# Patient Record
Sex: Male | Born: 1945 | Race: Black or African American | Hispanic: No | Marital: Married | Smoking: Never smoker
Health system: Southern US, Community
[De-identification: ages and names within clinical notes are randomized; demographics above are authoritative.]

## PROBLEM LIST (undated history)

## (undated) DIAGNOSIS — I10 Essential (primary) hypertension: Secondary | ICD-10-CM

## (undated) HISTORY — PX: BACK SURGERY: SHX140

---

## 2004-03-10 ENCOUNTER — Ambulatory Visit (HOSPITAL_COMMUNITY): Admission: RE | Admit: 2004-03-10 | Discharge: 2004-03-11 | Payer: Self-pay | Admitting: Neurosurgery

## 2004-04-08 ENCOUNTER — Encounter: Admission: RE | Admit: 2004-04-08 | Discharge: 2004-04-08 | Payer: Self-pay | Admitting: Neurosurgery

## 2019-12-23 ENCOUNTER — Emergency Department (HOSPITAL_BASED_OUTPATIENT_CLINIC_OR_DEPARTMENT_OTHER)
Admission: EM | Admit: 2019-12-23 | Discharge: 2019-12-23 | Disposition: A | Discharge status: Home / Self Care | Payer: Medicare Other | Attending: Emergency Medicine | Admitting: Emergency Medicine

## 2019-12-23 ENCOUNTER — Encounter (HOSPITAL_BASED_OUTPATIENT_CLINIC_OR_DEPARTMENT_OTHER): Payer: Self-pay

## 2019-12-23 ENCOUNTER — Emergency Department (HOSPITAL_BASED_OUTPATIENT_CLINIC_OR_DEPARTMENT_OTHER): Payer: Medicare Other

## 2019-12-23 ENCOUNTER — Other Ambulatory Visit: Payer: Self-pay

## 2019-12-23 DIAGNOSIS — Z79899 Other long term (current) drug therapy: Secondary | ICD-10-CM | POA: Diagnosis not present

## 2019-12-23 DIAGNOSIS — I10 Essential (primary) hypertension: Secondary | ICD-10-CM | POA: Insufficient documentation

## 2019-12-23 DIAGNOSIS — M5442 Lumbago with sciatica, left side: Secondary | ICD-10-CM | POA: Insufficient documentation

## 2019-12-23 DIAGNOSIS — M25552 Pain in left hip: Secondary | ICD-10-CM | POA: Diagnosis present

## 2019-12-23 HISTORY — DX: Essential (primary) hypertension: I10

## 2019-12-23 MED ORDER — METHYLPREDNISOLONE 4 MG PO TBPK: ORAL_TABLET | ORAL | 0 refills | Status: AC

## 2019-12-23 MED ORDER — ACETAMINOPHEN 500 MG PO TABS
1000.0000 mg | ORAL_TABLET | Freq: Once | ORAL | Status: AC
  Administered 2019-12-23: 1000 mg via ORAL
  Filled 2019-12-23: qty 2

## 2019-12-23 MED ORDER — PREDNISONE 50 MG PO TABS
60.0000 mg | ORAL_TABLET | Freq: Once | ORAL | Status: AC
  Administered 2019-12-23: 60 mg via ORAL
  Filled 2019-12-23: qty 1

## 2019-12-23 NOTE — ED Triage Notes (Signed)
Pt c/o left sided hip pain X 2 weeks.

## 2019-12-23 NOTE — ED Provider Notes (Signed)
[facility] EMERGENCY DEPARTMENT Provider Note   CSN: [id number] Arrival date & time: 12/23/19  1303     History Chief Complaint  Patient presents with  . Hip Pain    Curtis Caldwell is a 74 y.o. male.  Curtis Caldwell is a 74 y.o. male with a history of hypertension, who presents to the ED for evaluation of left-sided hip pain that has been occurring intermittently over the past 2 weeks.  History of similar pain on the right many years ago.  Reports that pain comes and goes, he states that occasionally it started to radiate down the side of his left leg.  He reports that today he was bending to get something out of his car and severe pain shot down his leg causing him to fall backwards.  He reports he landed primarily on his upper back, did not hit his head or land on his hip and denies any new pain since fall.  Has not had any numbness or weakness in the lower extremities, no loss of bowel or bladder control or saddle anesthesia.  No associated abdominal pain or urinary symptoms.  No fevers or chills.  He is taken 2 doses of ibuprofen intermittently to try and help but has not tried anything else to treat pain.  No other aggravating or relieving factors.        Past Medical History:  Diagnosis Date  . Hypertension     There are no problems to display for this patient.   Past Surgical History:  Procedure Laterality Date  . BACK SURGERY         No family history on file.  Social History   Tobacco Use  . Smoking status: Never Smoker  Substance Use Topics  . Alcohol use: Never  . Drug use: Never    Home Medications Prior to Admission medications   Medication Sig Start Date End Date Taking? Authorizing Provider  rosuvastatin (CRESTOR) 20 MG tablet  04/27/16  Yes [provider]  Olmesartan-amLODIPine-HCTZ 40-5-12.5 MG TABS Take 1 tablet by mouth at bedtime. 12/12/19   [provider]    Allergies    Patient has no known  allergies.  Review of Systems   Review of Systems  Constitutional: Negative for chills and fever.  HENT: Negative.   Respiratory: Negative for shortness of breath.   Cardiovascular: Negative for chest pain.  Gastrointestinal: Negative for abdominal pain, constipation, diarrhea, nausea and vomiting.  Genitourinary: Negative for dysuria, flank pain, frequency and hematuria.  Musculoskeletal: Positive for arthralgias (L hip) and back pain. Negative for gait problem, joint swelling, myalgias and neck pain.  Skin: Negative for color change, rash and wound.  Neurological: Negative for weakness and numbness.    Physical Exam Updated Vital Signs BP 139/80 (BP Location: Right Arm)   Pulse 64   Temp 98.6 F (37 C) (Oral)   Resp 18   Ht 6\' 4"  (1.93 m)   Wt 100.2 kg   SpO2 100%   BMI 26.90 kg/m   Physical Exam Vitals and nursing note reviewed.  Constitutional:      General: He is not in acute distress.    Appearance: He is well-developed. He is not diaphoretic.  HENT:     Head: Atraumatic.  Eyes:     General:        Right eye: No discharge.        Left eye: No discharge.  Cardiovascular:     Pulses:  Radial pulses are 2+ on the right side and 2+ on the left side.       Dorsalis pedis pulses are 2+ on the right side and 2+ on the left side.       Posterior tibial pulses are 2+ on the right side and 2+ on the left side.  Pulmonary:     Effort: Pulmonary effort is normal. No respiratory distress.  Abdominal:     General: Bowel sounds are normal. There is no distension.     Palpations: Abdomen is soft. There is no mass.     Tenderness: There is no abdominal tenderness. There is no guarding.     Comments: Abdomen soft, nondistended, nontender to palpation in all quadrants without guarding or peritoneal signs, no CVA tenderness bilaterally  Musculoskeletal:     Cervical back: Neck supple.     Comments: Tenderness to palpation over left low back musculature, no focal bony  tenderness over the hip and normal range of motion.  Pain made worse with range of motion of the lower extremities and sideways twisting  Skin:    General: Skin is warm and dry.     Capillary Refill: Capillary refill takes less than 2 seconds.  Neurological:     Mental Status: He is alert and oriented to person, place, and time.     Comments: Alert, clear speech, following commands. Moving all extremities without difficulty. Bilateral lower extremities with 5/5 strength in proximal and distal muscle groups and with dorsi and plantar flexion. Sensation intact in bilateral lower extremities. Ambulatory with steady gait  Psychiatric:        Behavior: Behavior normal.     ED Results / Procedures / Treatments   Labs (all labs ordered are listed, but only abnormal results are displayed) Labs Reviewed - No data to display  EKG None  Radiology DG Lumbar Spine Complete  Result Date: 12/23/2019 CLINICAL DATA:  Fall, back pain EXAM: LUMBAR SPINE - COMPLETE 4+ VIEW COMPARISON:  None. FINDINGS: Levoscoliosis of the spine noted. Advanced lumbar degenerative change at L4-5 and L5-S1 with disc space narrowing, vacuum disc phenomena, and endplate osteophytes. No acute compression fracture, wedge-shaped deformity or focal kyphosis. Lower lumbar facet arthropathy as well at L4-S1. No visualized pars defects. Visualized pelvis intact. Nonobstructive bowel gas pattern. IMPRESSION: Lower lumbar spine degenerative changes and associated scoliosis. No acute finding by plain radiography. Electronically Signed   By: Judie Petit.  Shick M.D.   On: 12/23/2019 14:53   DG Hip Unilat W or Wo Pelvis 2-3 Views Left  Result Date: 12/23/2019 CLINICAL DATA:  Fall, low back and hip pain EXAM: DG HIP (WITH OR WITHOUT PELVIS) 2-3V LEFT COMPARISON:  None. FINDINGS: Intact left hip. Negative for fracture. Bony pelvis and hips appear symmetric. Degenerative changes of the lumbosacral spine. Benign pelvic vascular calcifications. Penile  prosthesis noted. IMPRESSION: No acute osseous finding. Electronically Signed   By: Judie Petit.  Shick M.D.   On: 12/23/2019 14:50    Procedures Procedures (including critical care time)  Medications Ordered in ED Medications  acetaminophen (TYLENOL) tablet 1,000 mg (1,000 mg Oral Given 12/23/19 1442)  predniSONE (DELTASONE) tablet 60 mg (60 mg Oral Given 12/23/19 1442)    ED Course  I have reviewed the triage vital signs and the nursing notes.  Pertinent labs & imaging results that were available during my care of the patient were reviewed by me and considered in my medical decision making (see chart for details).    MDM Rules/Calculators/A&P  74 year old male arrives complaining of left hip pain intermittently over the past 2 weeks, he reports pain seems to radiate down his leg with certain motions and today when trying to get something out of his car pain shot down his leg causing him to fall backwards, he did not land on his back or hip and did not hit his head, denies any new pain since falling but decided to come in for evaluation of this left hip and back pain.  On exam he has tenderness over the left low back musculature and with certain movements pain radiates down the lateral aspect of the leg.  He has no neurologic deficits on exam, no loss of bowel or bladder control or saddle anesthesia to suggest cauda equina syndrome.  Suspect patient may have sciatica or lumbar radiculopathy.  X-rays performed which show some chronic degenerative changes of the lumbar spine but no acute fracture or abnormality of the lumbar spine or left hip.  Went in to discuss results with patient and he had left from the department, it seems that he got confused after speaking to one of the registration staff and thought that he was done and ready to leave.  I called and spoke with the patient over the phone and discussed his x-ray results.  Will send in prescription for Medrol Dosepak to his  pharmacy to help with inflammation I have asked him to take Tylenol and use over-the-counter lidocaine patches as well and follow-up with his PCP if not improving.  Final Clinical Impression(s) / ED Diagnoses Final diagnoses:  Acute left-sided low back pain with left-sided sciatica    Rx / DC Orders ED Discharge Orders         Ordered    methylPREDNISolone (MEDROL DOSEPAK) 4 MG TBPK tablet     Discontinue  Reprint     12/23/19 1555           Dartha Lodge, PA-C 12/23/19 1600    Linwood Dibbles, MD 12/24/19 [phone]

## 2021-11-27 IMAGING — CR DG HIP (WITH OR WITHOUT PELVIS) 2-3V*L*
3 series · 3 of 3 positions shown · non-contrast
Comparison: None.

CLINICAL DATA: Fall, low back and hip pain

EXAM:
DG HIP (WITH OR WITHOUT PELVIS) 2-3V LEFT

[t pelvis a.p.]
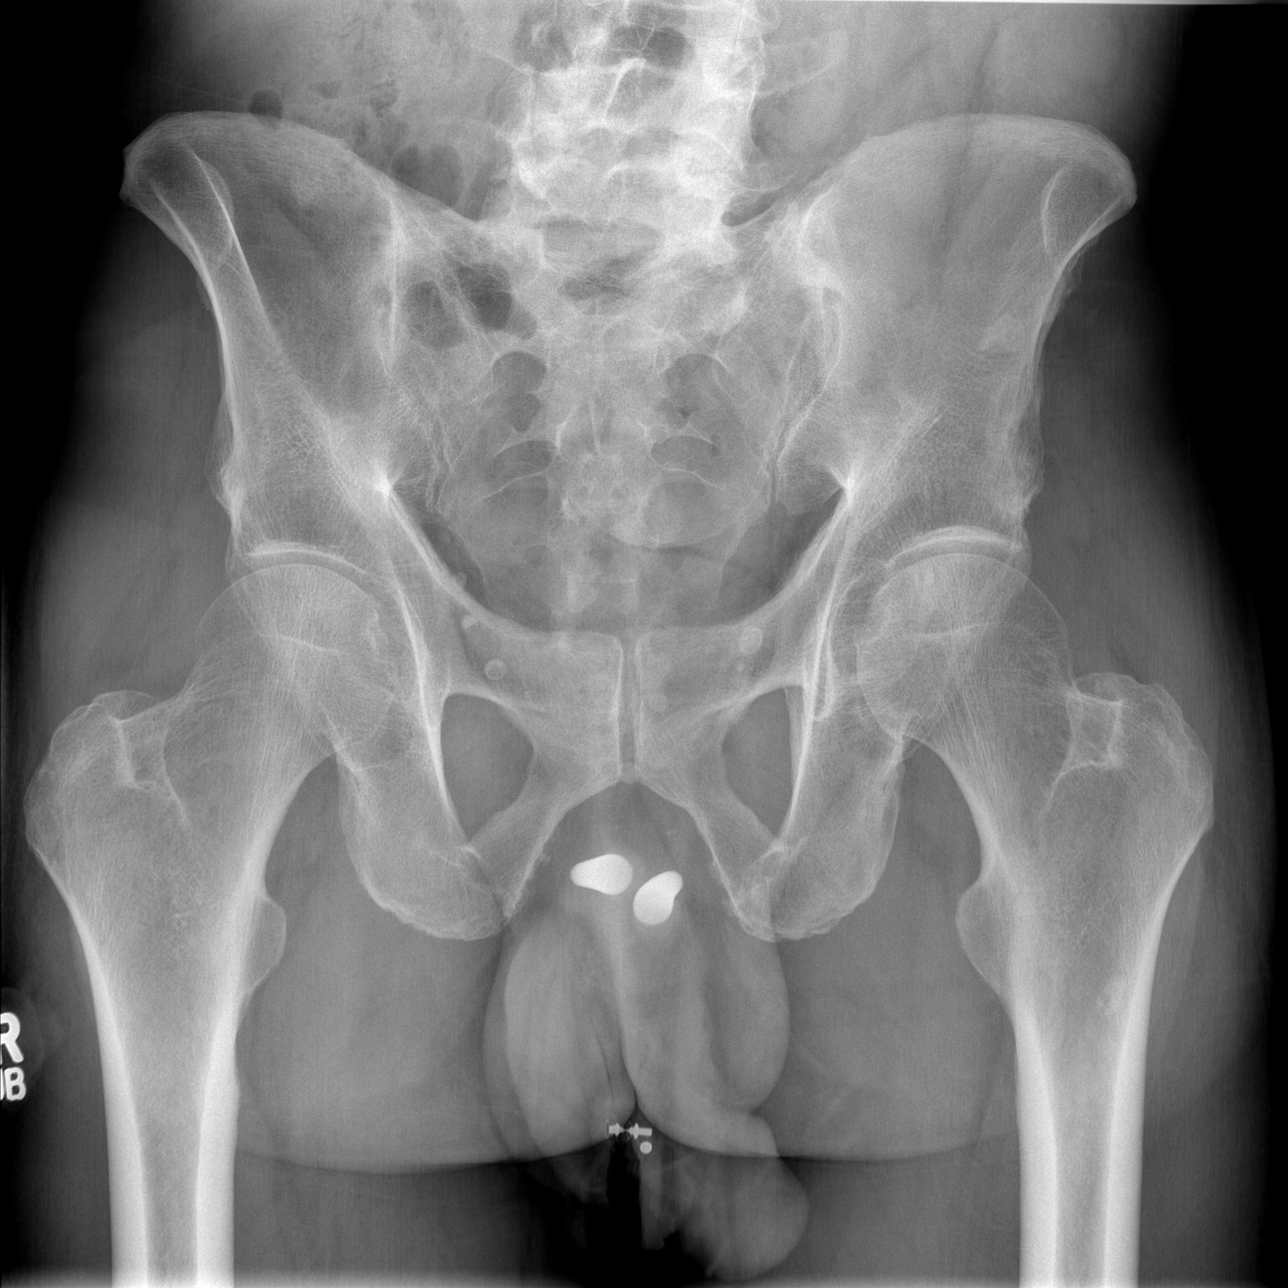

[t hip ap left]
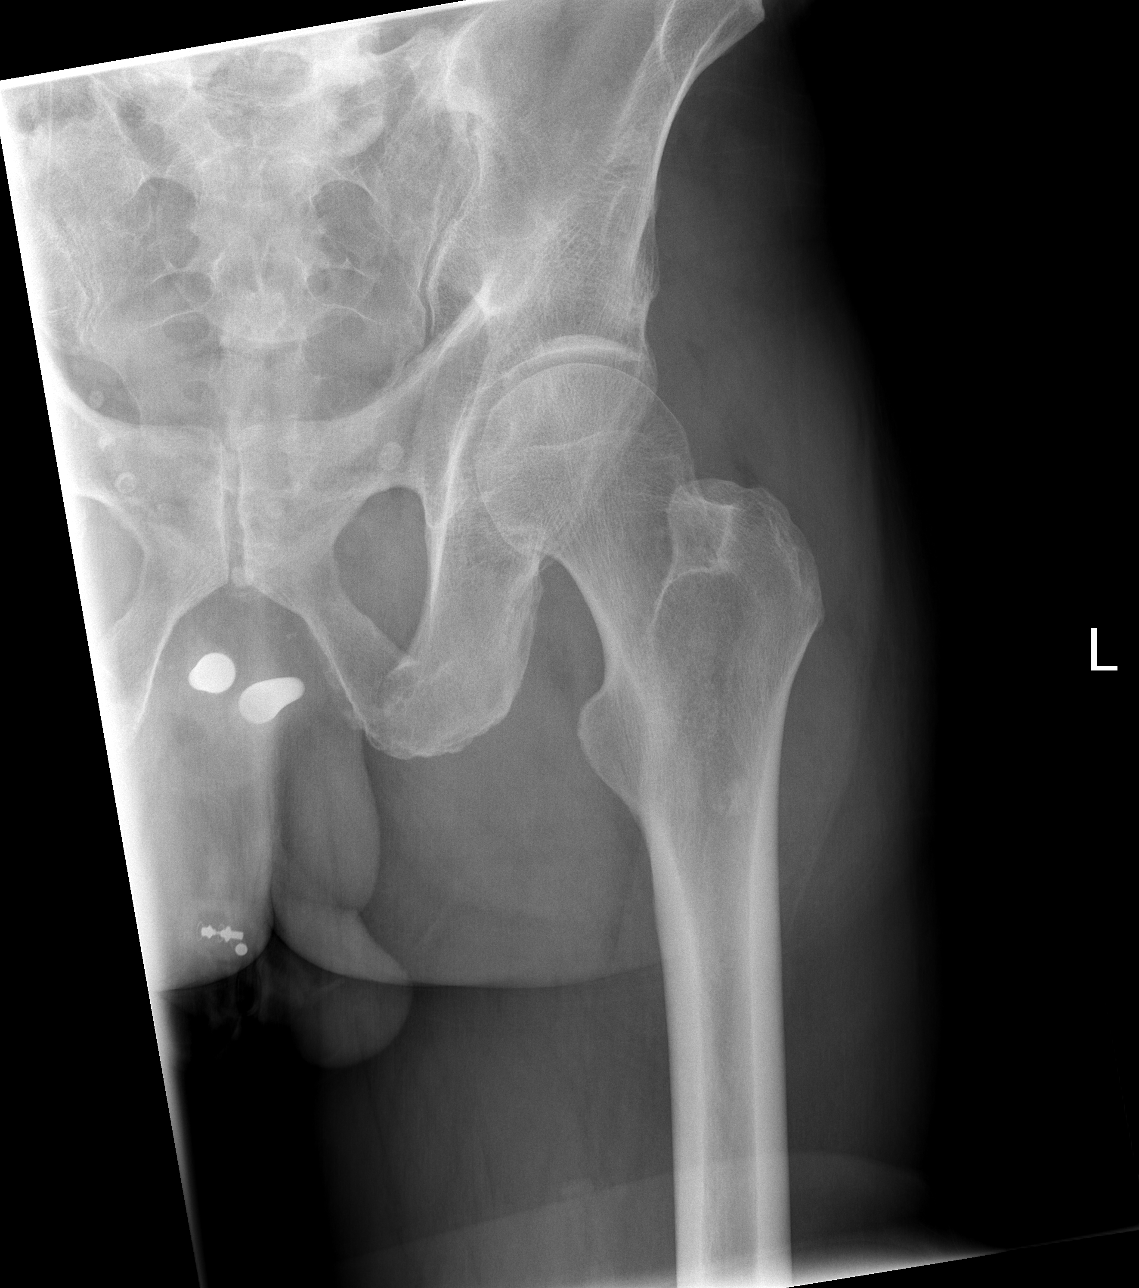

[t hip frog leg left]
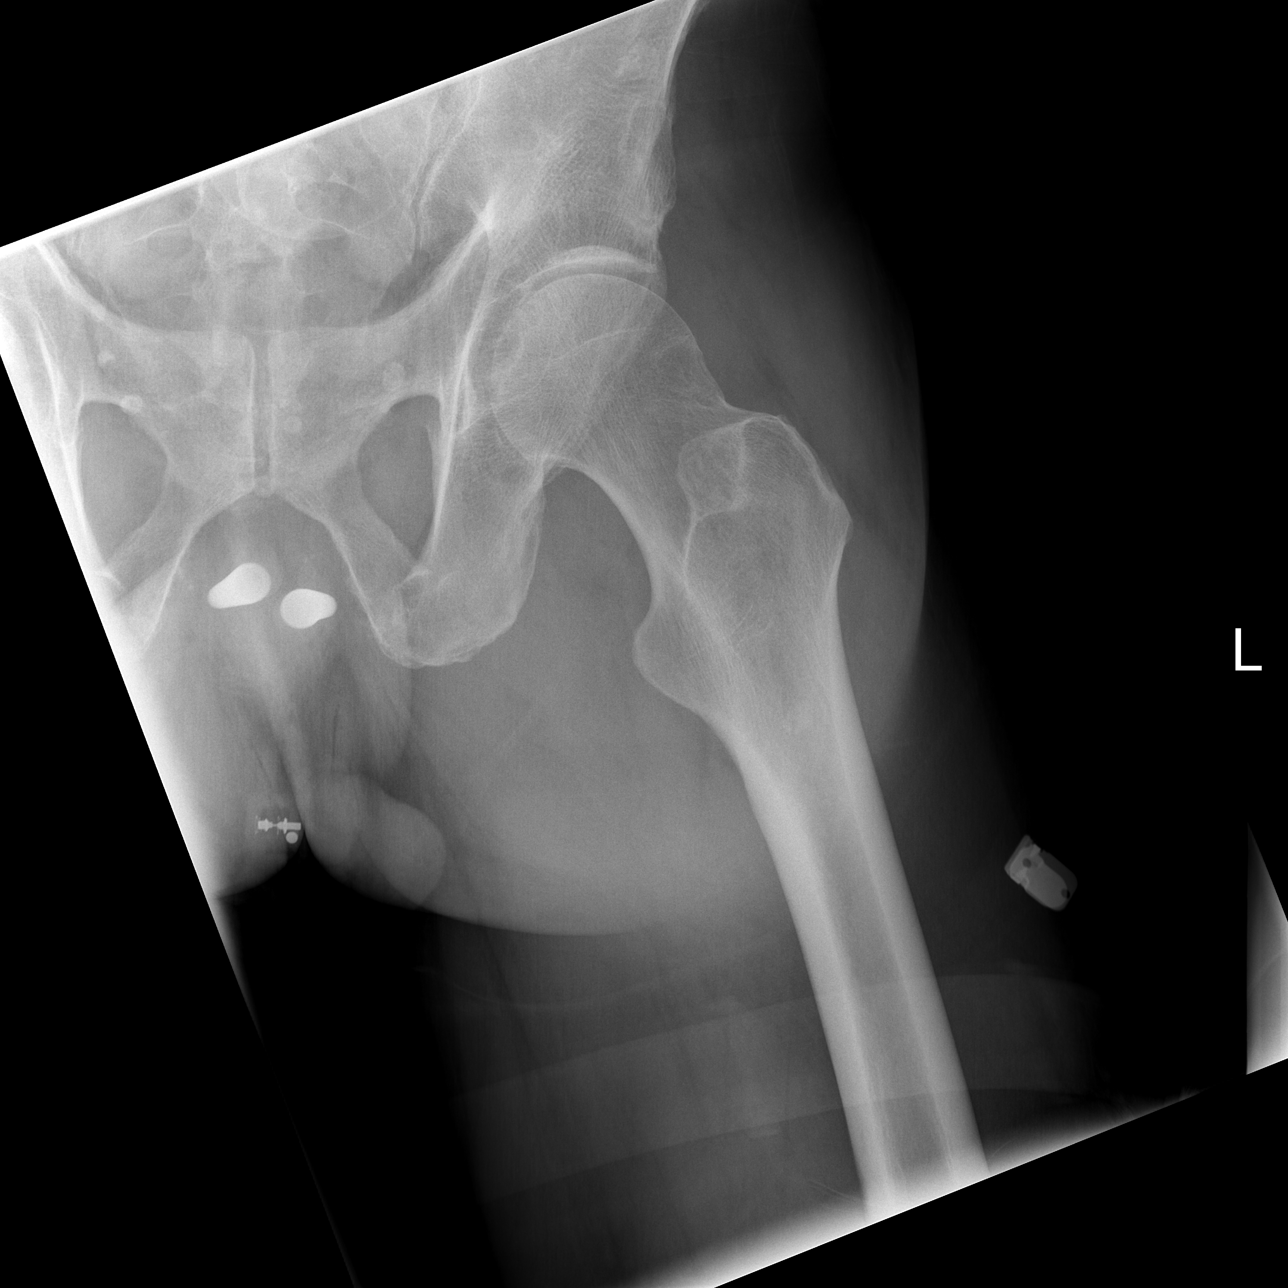

[3 of 3 positions shown; findings below may reference images not displayed]

FINDINGS: Intact left hip. Negative for fracture. Bony pelvis and hips appear
symmetric. Degenerative changes of the lumbosacral spine. Benign
pelvic vascular calcifications. Penile prosthesis noted.
IMPRESSION: No acute osseous finding.
